# Patient Record
Sex: Male | Born: 1995 | Race: White | Hispanic: No | Marital: Single | Smoking: Never smoker
Health system: Southern US, Community
[De-identification: ages and names within clinical notes are randomized; demographics above are authoritative.]

## PROBLEM LIST (undated history)

## (undated) DIAGNOSIS — T7840XA Allergy, unspecified, initial encounter: Secondary | ICD-10-CM

## (undated) HISTORY — DX: Allergy, unspecified, initial encounter: T78.40XA

---
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
## 2017-07-17 ENCOUNTER — Ambulatory Visit
Admission: RE | Admit: 2017-07-17 | Discharge: 2017-07-17 | Disposition: A | Discharge status: Home / Self Care | Payer: Self-pay | Source: Ambulatory Visit | Attending: Family Medicine | Admitting: Family Medicine

## 2017-07-17 ENCOUNTER — Other Ambulatory Visit: Payer: Self-pay | Admitting: Family Medicine

## 2017-07-17 DIAGNOSIS — M549 Dorsalgia, unspecified: Secondary | ICD-10-CM

## 2017-07-17 DIAGNOSIS — M438X6 Other specified deforming dorsopathies, lumbar region: Secondary | ICD-10-CM | POA: Insufficient documentation

## 2017-07-30 ENCOUNTER — Other Ambulatory Visit: Payer: Self-pay | Admitting: Family Medicine

## 2017-07-30 DIAGNOSIS — M545 Low back pain: Secondary | ICD-10-CM

## 2017-07-31 ENCOUNTER — Other Ambulatory Visit: Payer: Self-pay | Admitting: Family Medicine

## 2017-07-31 DIAGNOSIS — T1590XA Foreign body on external eye, part unspecified, unspecified eye, initial encounter: Secondary | ICD-10-CM

## 2017-08-03 ENCOUNTER — Ambulatory Visit
Admission: RE | Admit: 2017-08-03 | Discharge: 2017-08-03 | Disposition: A | Discharge status: Home / Self Care | Payer: Self-pay | Source: Ambulatory Visit | Attending: Family Medicine | Admitting: Family Medicine

## 2017-08-03 DIAGNOSIS — T1590XA Foreign body on external eye, part unspecified, unspecified eye, initial encounter: Secondary | ICD-10-CM

## 2017-08-03 DIAGNOSIS — M545 Low back pain: Secondary | ICD-10-CM | POA: Insufficient documentation

## 2017-08-03 DIAGNOSIS — M4856XA Collapsed vertebra, not elsewhere classified, lumbar region, initial encounter for fracture: Secondary | ICD-10-CM | POA: Insufficient documentation

## 2017-08-03 DIAGNOSIS — Z1389 Encounter for screening for other disorder: Secondary | ICD-10-CM | POA: Insufficient documentation

## 2022-06-28 ENCOUNTER — Encounter: Payer: Self-pay | Admitting: Physician Assistant

## 2022-06-28 ENCOUNTER — Ambulatory Visit: Payer: Medicaid Other | Admitting: Physician Assistant

## 2022-06-28 VITALS — BP 100/55 | HR 90 | Temp 98.0°F | Resp 16 | Ht 68.0 in | Wt 224.2 lb

## 2022-06-28 DIAGNOSIS — M542 Cervicalgia: Secondary | ICD-10-CM

## 2022-06-28 DIAGNOSIS — M5442 Lumbago with sciatica, left side: Secondary | ICD-10-CM

## 2022-06-28 DIAGNOSIS — R200 Anesthesia of skin: Secondary | ICD-10-CM | POA: Diagnosis not present

## 2022-06-28 DIAGNOSIS — M5441 Lumbago with sciatica, right side: Secondary | ICD-10-CM

## 2022-06-28 DIAGNOSIS — G8929 Other chronic pain: Secondary | ICD-10-CM

## 2022-06-28 DIAGNOSIS — R202 Paresthesia of skin: Secondary | ICD-10-CM

## 2022-06-28 DIAGNOSIS — R519 Headache, unspecified: Secondary | ICD-10-CM

## 2022-06-28 DIAGNOSIS — F419 Anxiety disorder, unspecified: Secondary | ICD-10-CM

## 2022-06-28 MED ORDER — ESCITALOPRAM OXALATE 5 MG PO TABS: 5.0000 mg | ORAL_TABLET | Freq: Every day | ORAL | 0 refills | Status: AC

## 2022-06-28 MED ORDER — GABAPENTIN 100 MG PO CAPS: 100.0000 mg | ORAL_CAPSULE | Freq: Three times a day (TID) | ORAL | 3 refills | Status: AC
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
## 2022-06-29 ENCOUNTER — Telehealth: Payer: Self-pay | Admitting: Physician Assistant

## 2022-06-29 NOTE — Telephone Encounter (Signed)
Neurosurgery referral sent via [facility] to [facility]

## 2022-07-11 ENCOUNTER — Telehealth: Payer: Self-pay | Admitting: Physician Assistant

## 2022-07-11 NOTE — Telephone Encounter (Signed)
Patient called stating he has not been scheduled for MRI yet. I explained to him, we did not order MRI, we referred him to neurosurgeon. I gave him tele # for [place] [facility] [facility]

## 2022-08-09 ENCOUNTER — Ambulatory Visit: Payer: Medicaid Other | Admitting: Physician Assistant

## 2022-08-14 ENCOUNTER — Telehealth: Payer: Self-pay | Admitting: Physician Assistant

## 2022-08-14 NOTE — Telephone Encounter (Signed)
Lvm to re-sch no show appt-nm 

## 2022-10-16 ENCOUNTER — Telehealth: Payer: Self-pay | Admitting: Physician Assistant

## 2022-10-16 NOTE — Telephone Encounter (Signed)
Neurosurgery referral redirected to  [facility] via [facility]

## 2022-10-16 NOTE — Telephone Encounter (Signed)
Error

## 2022-10-18 ENCOUNTER — Other Ambulatory Visit: Payer: Self-pay | Admitting: Physician Assistant

## 2022-10-18 DIAGNOSIS — S32008A Other fracture of unspecified lumbar vertebra, initial encounter for closed fracture: Secondary | ICD-10-CM

## 2022-10-18 DIAGNOSIS — S0083XA Contusion of other part of head, initial encounter: Secondary | ICD-10-CM

## 2022-10-18 DIAGNOSIS — M542 Cervicalgia: Secondary | ICD-10-CM

## 2023-01-07 ENCOUNTER — Emergency Department
Admission: EM | Admit: 2023-01-07 | Discharge: 2023-01-07 | Disposition: A | Discharge status: Home / Self Care | Payer: Medicaid Other | Attending: Emergency Medicine | Admitting: Emergency Medicine

## 2023-01-07 ENCOUNTER — Encounter: Payer: Self-pay | Admitting: *Deleted

## 2023-01-07 ENCOUNTER — Other Ambulatory Visit: Payer: Self-pay

## 2023-01-07 ENCOUNTER — Emergency Department: Payer: Medicaid Other

## 2023-01-07 DIAGNOSIS — S61432A Puncture wound without foreign body of left hand, initial encounter: Secondary | ICD-10-CM | POA: Insufficient documentation

## 2023-01-07 DIAGNOSIS — S6992XA Unspecified injury of left wrist, hand and finger(s), initial encounter: Secondary | ICD-10-CM

## 2023-01-07 DIAGNOSIS — W298XXA Contact with other powered powered hand tools and household machinery, initial encounter: Secondary | ICD-10-CM | POA: Insufficient documentation

## 2023-01-07 DIAGNOSIS — Z23 Encounter for immunization: Secondary | ICD-10-CM | POA: Insufficient documentation

## 2023-01-07 MED ORDER — OXYCODONE-ACETAMINOPHEN 5-325 MG PO TABS: 1.0000 | ORAL_TABLET | Freq: Four times a day (QID) | ORAL | 0 refills | Status: AC | PRN

## 2023-01-07 MED ORDER — CEPHALEXIN 500 MG PO CAPS: 500.0000 mg | ORAL_CAPSULE | Freq: Four times a day (QID) | ORAL | 0 refills | Status: AC

## 2023-01-07 MED ORDER — OXYCODONE-ACETAMINOPHEN 5-325 MG PO TABS
1.0000 | ORAL_TABLET | Freq: Once | ORAL | Status: AC
  Administered 2023-01-07: 1 via ORAL
  Filled 2023-01-07: qty 1

## 2023-01-07 MED ORDER — TETANUS-DIPHTH-ACELL PERTUSSIS 5-2.5-18.5 LF-MCG/0.5 IM SUSY
0.5000 mL | PREFILLED_SYRINGE | Freq: Once | INTRAMUSCULAR | Status: AC
  Administered 2023-01-07: 0.5 mL via INTRAMUSCULAR
  Filled 2023-01-07: qty 0.5

## 2023-01-07 NOTE — ED Provider Notes (Signed)
[facility] Provider Note    Event Date/Time   First MD Initiated Contact with Patient 01/07/23 1238     (approximate)   History   Hand Injury   HPI  Arinze Wolter is a 27 y.o. male presents to the ED for evaluation of a hand injury.  Patient was using a drill earlier today when he drilled through his left hand.  It began bleeding immediately and patient noticed that the drill bit was broken.  He he only came to the ED to make sure it was not still in his hand.     Physical Exam   Triage Vital Signs: ED Triage Vitals  Encounter Vitals Group     BP 01/07/23 1216 122/78     Systolic BP Percentile --      Diastolic BP Percentile --      Pulse Rate 01/07/23 1216 96     Resp 01/07/23 1216 17     Temp 01/07/23 1216 97.9 F (36.6 C)     Temp Source 01/07/23 1216 Oral     SpO2 01/07/23 1216 99 %     Weight 01/07/23 1241 224 lb 3.3 oz (101.7 kg)     Height 01/07/23 1241 5\' 8"  (1.727 m)     Head Circumference --      Peak Flow --      Pain Score 01/07/23 1222 5     Pain Loc --      Pain Education --      Exclude from Growth Chart --     Most recent vital signs: Vitals:   01/07/23 1216  BP: 122/78  Pulse: 96  Resp: 17  Temp: 97.9 F (36.6 C)  SpO2: 99%    General: Awake, no distress.  CV:  Good peripheral perfusion. Resp:  Normal effort.  Abd:  No distention.  Other:  Left hand swollen when compared to the right, there are 2 puncture wounds to the dorsal side of the hand, radial pulse 2+ and regular, capillary refill is appropriate, sensation is intact, patient is unable to fully extend his fingers.   ED Results / Procedures / Treatments   Labs (all labs ordered are listed, but only abnormal results are displayed) Labs Reviewed - No data to display   RADIOLOGY  Left hand x-ray obtained, interpreted the images as well as reviewed the radiologist report which was negative for fracture and retained foreign  body.   PROCEDURES:  Critical Care performed: No  Procedures   MEDICATIONS ORDERED IN ED: Medications  Tdap (BOOSTRIX) injection 0.5 mL (0.5 mLs Intramuscular Given 01/07/23 1254)  oxyCODONE-acetaminophen (PERCOCET/ROXICET) 5-325 MG per tablet 1 tablet (1 tablet Oral Given 01/07/23 1253)     IMPRESSION / MDM / ASSESSMENT AND PLAN / ED COURSE  I reviewed the triage vital signs and the nursing notes.                             27 year old male presents for evaluation of a left hand injury.  Vital signs stable in triage patient NAD on exam.  Differential diagnosis includes, but is not limited to, fracture, retained foreign body, puncture wound, tendon injury, nerve injury.  Patient's presentation is most consistent with acute complicated illness / injury requiring diagnostic workup.  Left hand x-rays obtained, interpreted the images as well as reviewed the radiologist report which was negative for fracture and retained foreign body.  Based on the size of  the puncture wound I do not feel that absolutely necessary to suture closed.  I reviewed the risk benefits and alternatives with the patient.  He would prefer to not have stitches at this time.  I also discussed that since he is unable to fully extend his fingers he may have tendon damage and that usually we place people in a splint for this.  I do feel that his inability to fully extend is more likely due to pain and swelling than a true tendon injury.  Patient was not interested in being splinted, but I will give him follow-up with hand surgery.  Wound was irrigated copiously and cleaned with iodine.  I dressed it with nonadherent gauze and Coban.  Patient will be given antibiotics.  His tetanus shot was updated today.  Patient was agreeable to plan, all questions were answered and he was stable at discharge.      FINAL CLINICAL IMPRESSION(S) / ED DIAGNOSES   Final diagnoses:  Injury of left hand, initial encounter     Rx / DC  Orders   ED Discharge Orders          Ordered    oxyCODONE-acetaminophen (PERCOCET) 5-325 MG tablet  Every 6 hours PRN        01/07/23 1354    cephALEXin (KEFLEX) 500 MG capsule  4 times daily        01/07/23 1354             Note:  This document was prepared using Dragon voice recognition software and may include unintentional dictation errors.   Cameron Ali, PA-C 01/07/23 1356    Jene Every, MD 01/07/23 1454

## 2023-01-07 NOTE — ED Triage Notes (Signed)
Pt accidentally drilled into his hand, he thinks there might be a drill bit stuck in his hand as it was broken off from drill.  Pt has pain and swelling to left hand.  Last tetanus unknown
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
## 2023-01-07 NOTE — Discharge Instructions (Addendum)
Please take the antibiotics as prescribed.  Watch for signs infection including warmth, redness, swelling, purulent discharge and pain.  If you develop any of these signs please return to the ED.  Wash the wound with soap and water daily.  Please keep covered while you are at work.  You can take the Percocet every 6 hours as needed for severe pain.  Please keep in mind that this also contains acetaminophen which is the main ingredient in Tylenol.  If you need additional pain medication take ibuprofen.  If you are unable to fully extend your fingers please follow-up with hand surgery, you can call the office, their information is attached.

## 2023-01-14 ENCOUNTER — Ambulatory Visit: Payer: Medicaid Other | Admitting: Orthopedic Surgery

## 2023-01-22 ENCOUNTER — Encounter: Payer: Self-pay | Admitting: Orthopedic Surgery

## 2023-06-03 ENCOUNTER — Other Ambulatory Visit: Payer: Self-pay | Admitting: Internal Medicine

## 2023-06-03 DIAGNOSIS — S32008A Other fracture of unspecified lumbar vertebra, initial encounter for closed fracture: Secondary | ICD-10-CM

## 2023-06-13 ENCOUNTER — Ambulatory Visit
Admission: RE | Admit: 2023-06-13 | Discharge: 2023-06-13 | Disposition: A | Discharge status: Home / Self Care | Payer: Medicaid Other | Source: Ambulatory Visit | Attending: Internal Medicine | Admitting: Internal Medicine

## 2023-06-13 DIAGNOSIS — S32008A Other fracture of unspecified lumbar vertebra, initial encounter for closed fracture: Secondary | ICD-10-CM | POA: Diagnosis present
